# Patient Record
Sex: Male | Born: 1975 | Race: Black or African American | Hispanic: No | Marital: Single | Smoking: Current every day smoker
Health system: Southern US, Community
[De-identification: ages and names within clinical notes are randomized; demographics above are authoritative.]

## PROBLEM LIST (undated history)

## (undated) DIAGNOSIS — S329XXA Fracture of unspecified parts of lumbosacral spine and pelvis, initial encounter for closed fracture: Secondary | ICD-10-CM

## (undated) DIAGNOSIS — Z87442 Personal history of urinary calculi: Secondary | ICD-10-CM

## (undated) DIAGNOSIS — N200 Calculus of kidney: Secondary | ICD-10-CM

## (undated) DIAGNOSIS — G610 Guillain-Barre syndrome: Secondary | ICD-10-CM

## (undated) DIAGNOSIS — S2249XA Multiple fractures of ribs, unspecified side, initial encounter for closed fracture: Secondary | ICD-10-CM

## (undated) DIAGNOSIS — S2239XA Fracture of one rib, unspecified side, initial encounter for closed fracture: Secondary | ICD-10-CM

## (undated) DIAGNOSIS — S0990XA Unspecified injury of head, initial encounter: Secondary | ICD-10-CM

## (undated) DIAGNOSIS — N289 Disorder of kidney and ureter, unspecified: Secondary | ICD-10-CM

## (undated) DIAGNOSIS — Z87440 Personal history of urinary (tract) infections: Secondary | ICD-10-CM

## (undated) DIAGNOSIS — I10 Essential (primary) hypertension: Secondary | ICD-10-CM

## (undated) DIAGNOSIS — R51 Headache: Secondary | ICD-10-CM

## (undated) DIAGNOSIS — R519 Headache, unspecified: Secondary | ICD-10-CM

## (undated) HISTORY — PX: SPLENECTOMY: SUR1306

## (undated) HISTORY — PX: KNEE SURGERY: SHX244

---

## 2000-01-03 ENCOUNTER — Encounter: Payer: Self-pay | Admitting: Emergency Medicine

## 2000-01-04 ENCOUNTER — Inpatient Hospital Stay (HOSPITAL_COMMUNITY): Admission: EM | Admit: 2000-01-04 | Discharge: 2000-01-13 | Payer: Self-pay

## 2000-01-04 ENCOUNTER — Encounter: Payer: Self-pay | Admitting: Surgery

## 2000-01-04 ENCOUNTER — Encounter: Payer: Self-pay | Admitting: Emergency Medicine

## 2000-01-04 ENCOUNTER — Encounter: Payer: Self-pay | Admitting: Otolaryngology

## 2000-01-05 ENCOUNTER — Encounter: Payer: Self-pay | Admitting: Surgery

## 2000-01-06 ENCOUNTER — Encounter: Payer: Self-pay | Admitting: General Surgery

## 2000-01-08 ENCOUNTER — Encounter: Payer: Self-pay | Admitting: General Surgery

## 2000-01-09 ENCOUNTER — Encounter: Payer: Self-pay | Admitting: General Surgery

## 2000-01-10 ENCOUNTER — Encounter: Payer: Self-pay | Admitting: General Surgery

## 2000-01-11 ENCOUNTER — Encounter: Payer: Self-pay | Admitting: General Surgery

## 2000-01-13 ENCOUNTER — Encounter: Payer: Self-pay | Admitting: Surgery

## 2000-01-21 ENCOUNTER — Ambulatory Visit (HOSPITAL_COMMUNITY): Admission: RE | Admit: 2000-01-21 | Discharge: 2000-01-21 | Payer: Self-pay

## 2000-06-21 ENCOUNTER — Emergency Department (HOSPITAL_COMMUNITY): Admission: EM | Admit: 2000-06-21 | Discharge: 2000-06-21 | Payer: Self-pay | Admitting: *Deleted

## 2000-06-25 ENCOUNTER — Inpatient Hospital Stay (HOSPITAL_COMMUNITY): Admission: AD | Admit: 2000-06-25 | Discharge: 2000-09-21 | Payer: Self-pay | Admitting: Internal Medicine

## 2000-06-26 ENCOUNTER — Encounter: Payer: Self-pay | Admitting: Neurology

## 2000-06-26 ENCOUNTER — Encounter: Payer: Self-pay | Admitting: Pulmonary Disease

## 2000-06-27 ENCOUNTER — Encounter: Payer: Self-pay | Admitting: Pulmonary Disease

## 2000-06-28 ENCOUNTER — Encounter: Payer: Self-pay | Admitting: Neurology

## 2000-06-30 ENCOUNTER — Encounter: Payer: Self-pay | Admitting: Critical Care Medicine

## 2000-07-01 ENCOUNTER — Encounter: Payer: Self-pay | Admitting: Critical Care Medicine

## 2000-07-13 ENCOUNTER — Encounter: Payer: Self-pay | Admitting: Neurology

## 2000-07-17 ENCOUNTER — Encounter: Payer: Self-pay | Admitting: Neurology

## 2000-07-26 ENCOUNTER — Encounter: Payer: Self-pay | Admitting: Pulmonary Disease

## 2000-07-26 ENCOUNTER — Encounter: Payer: Self-pay | Admitting: Neurology

## 2000-07-27 ENCOUNTER — Encounter: Payer: Self-pay | Admitting: Neurology

## 2000-08-06 ENCOUNTER — Encounter: Payer: Self-pay | Admitting: Pediatrics

## 2000-09-03 ENCOUNTER — Encounter: Payer: Self-pay | Admitting: Neurology

## 2000-09-21 ENCOUNTER — Inpatient Hospital Stay (HOSPITAL_COMMUNITY)
Admission: RE | Admit: 2000-09-21 | Discharge: 2000-09-28 | Payer: Self-pay | Admitting: Physical Medicine and Rehabilitation

## 2000-09-28 ENCOUNTER — Inpatient Hospital Stay (HOSPITAL_COMMUNITY): Admission: AD | Admit: 2000-09-28 | Discharge: 2000-10-27 | Payer: Self-pay | Admitting: Neurology

## 2000-10-10 ENCOUNTER — Encounter: Payer: Self-pay | Admitting: Neurology

## 2000-10-14 ENCOUNTER — Encounter: Payer: Self-pay | Admitting: Neurology

## 2000-10-27 ENCOUNTER — Encounter: Payer: Self-pay | Admitting: Physical Medicine and Rehabilitation

## 2000-10-27 ENCOUNTER — Inpatient Hospital Stay (HOSPITAL_COMMUNITY)
Admission: RE | Admit: 2000-10-27 | Discharge: 2000-11-20 | Payer: Self-pay | Admitting: Physical Medicine and Rehabilitation

## 2000-11-04 ENCOUNTER — Encounter: Payer: Self-pay | Admitting: Physical Medicine and Rehabilitation

## 2000-11-25 ENCOUNTER — Encounter (HOSPITAL_COMMUNITY): Admission: RE | Admit: 2000-11-25 | Discharge: 2001-02-23 | Payer: Self-pay | Admitting: *Deleted

## 2000-11-25 ENCOUNTER — Encounter
Admission: RE | Admit: 2000-11-25 | Discharge: 2001-01-28 | Payer: Self-pay | Admitting: Physical Medicine and Rehabilitation

## 2000-11-30 ENCOUNTER — Emergency Department (HOSPITAL_COMMUNITY): Admission: EM | Admit: 2000-11-30 | Discharge: 2000-11-30 | Payer: Self-pay | Admitting: Emergency Medicine

## 2001-02-24 ENCOUNTER — Encounter (HOSPITAL_COMMUNITY): Admission: RE | Admit: 2001-02-24 | Discharge: 2001-05-25 | Payer: Self-pay | Admitting: *Deleted

## 2001-05-18 ENCOUNTER — Ambulatory Visit (HOSPITAL_COMMUNITY): Admission: RE | Admit: 2001-05-18 | Discharge: 2001-05-18 | Payer: Self-pay | Admitting: Neurology

## 2001-06-15 ENCOUNTER — Encounter (HOSPITAL_COMMUNITY): Admission: RE | Admit: 2001-06-15 | Discharge: 2001-09-13 | Payer: Self-pay | Admitting: Neurology

## 2001-08-19 ENCOUNTER — Ambulatory Visit (HOSPITAL_COMMUNITY): Admission: RE | Admit: 2001-08-19 | Discharge: 2001-08-19 | Payer: Self-pay | Admitting: Vascular Surgery

## 2004-05-17 ENCOUNTER — Emergency Department (HOSPITAL_COMMUNITY): Admission: EM | Admit: 2004-05-17 | Discharge: 2004-05-17 | Payer: Self-pay | Admitting: Emergency Medicine

## 2005-11-23 IMAGING — CR DG ABDOMEN ACUTE W/ 1V CHEST
3 series · 3 of 3 positions shown · non-contrast
Comparison: none

CLINICAL DATA: Lower abdominal pain.
 ACUTE ABDOMEN SERIES
 No prior studies. 
 There is indistinct opacity peripherally at the left lung base.  This was described on a prior chest radiograph from [HOSPITAL] dated 10/27/00 Although this could represent early pneumonia it more likely represents scarring given that it was described previously.  
 Bowel gas pattern appears unremarkable.  No significant abnormal calcific densities are identified.  No obvious organomegaly.  
 IMPRESSION
 Left basilar opacity likely represents scarring.  Otherwise negative examination.

[view not recorded (1 of 3)]
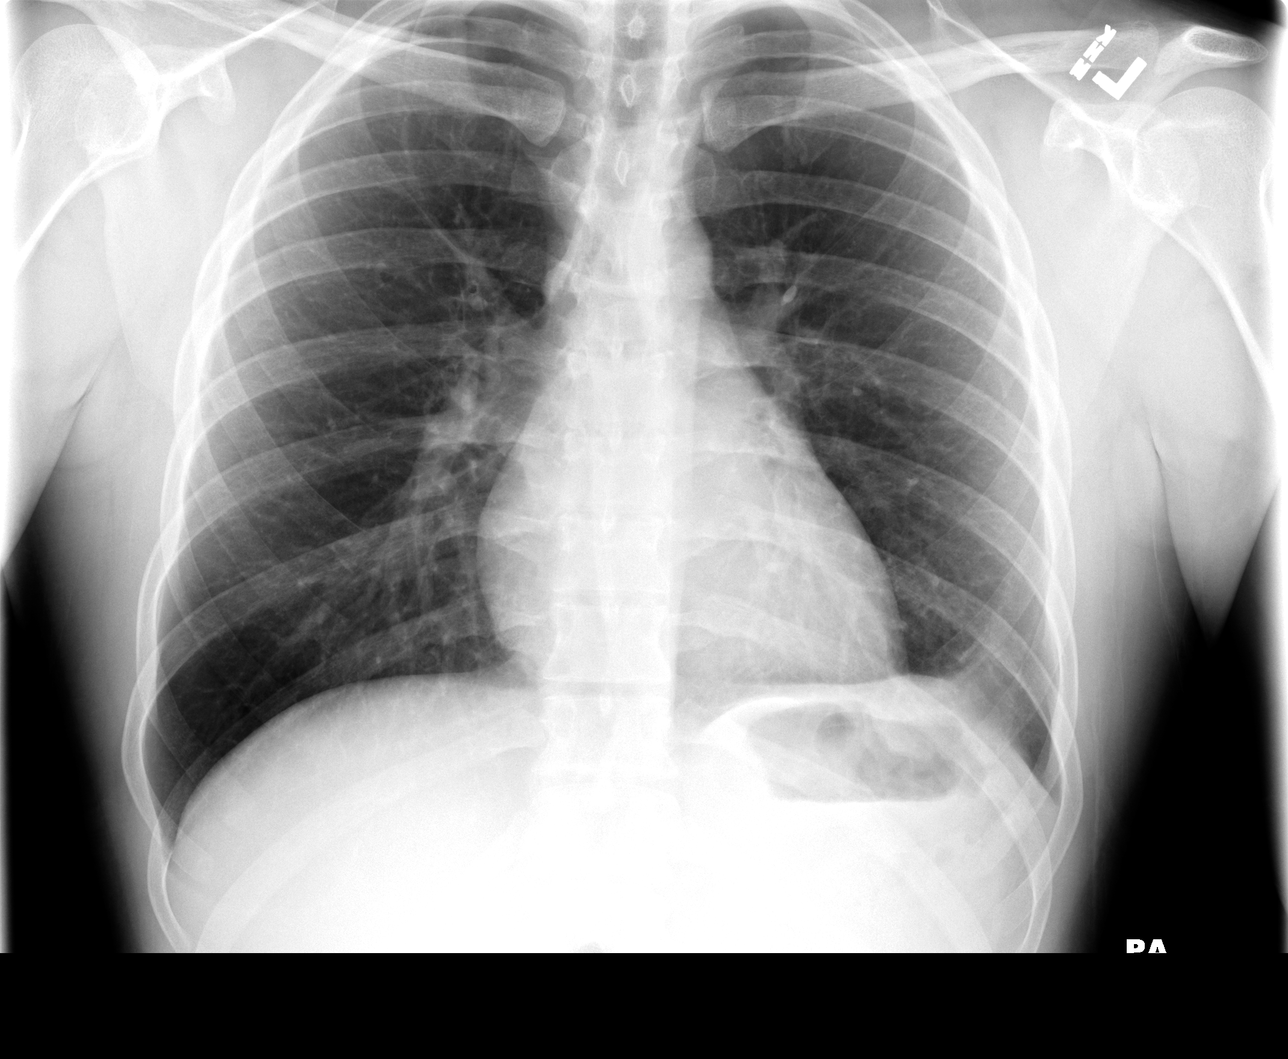

[view not recorded (2 of 3)]
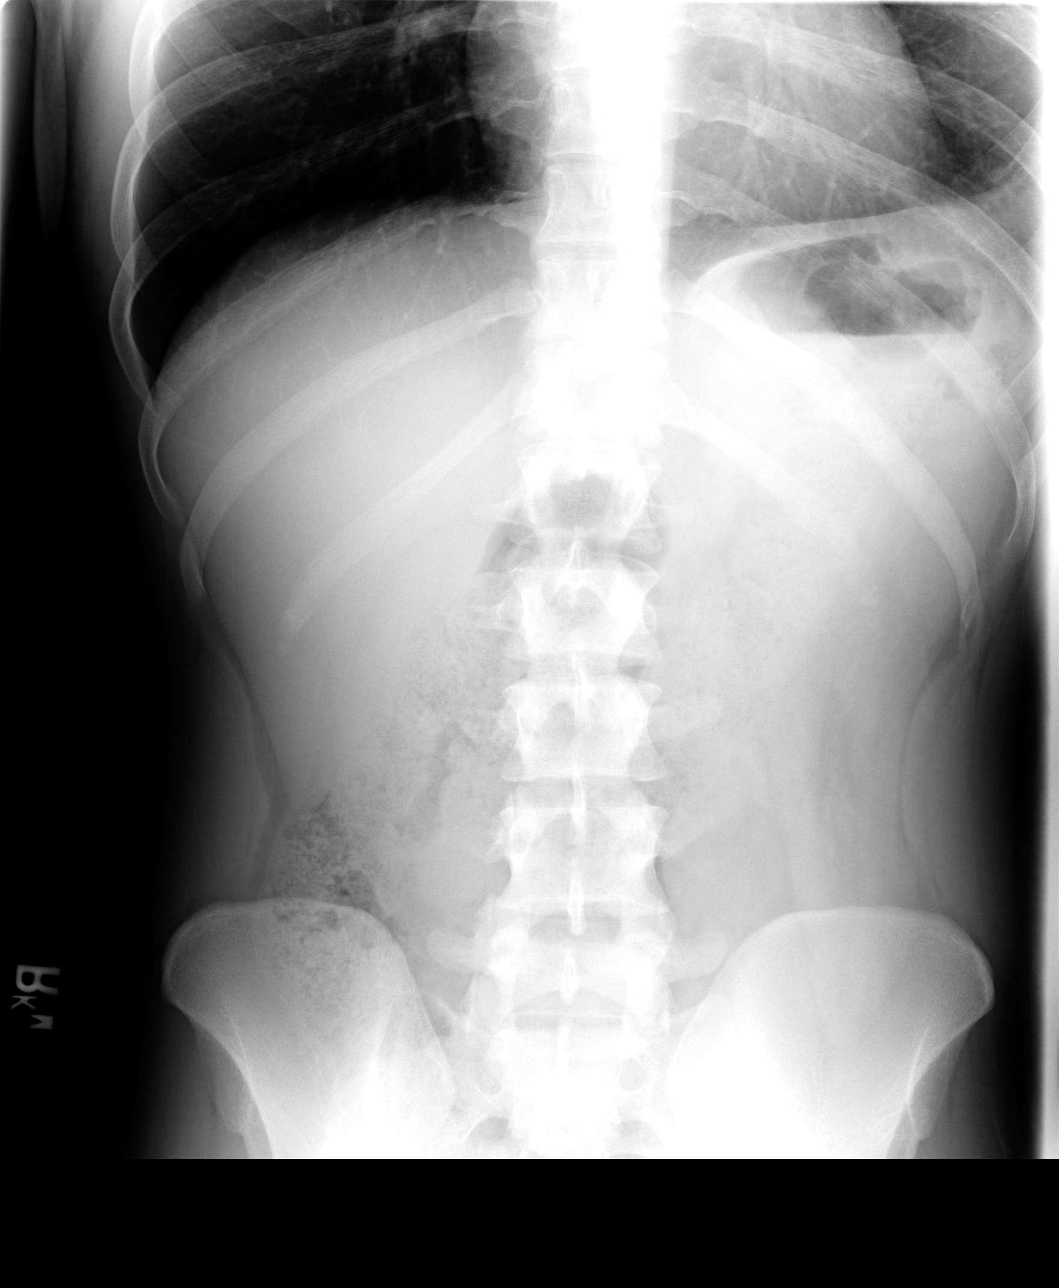

[view not recorded (3 of 3)]
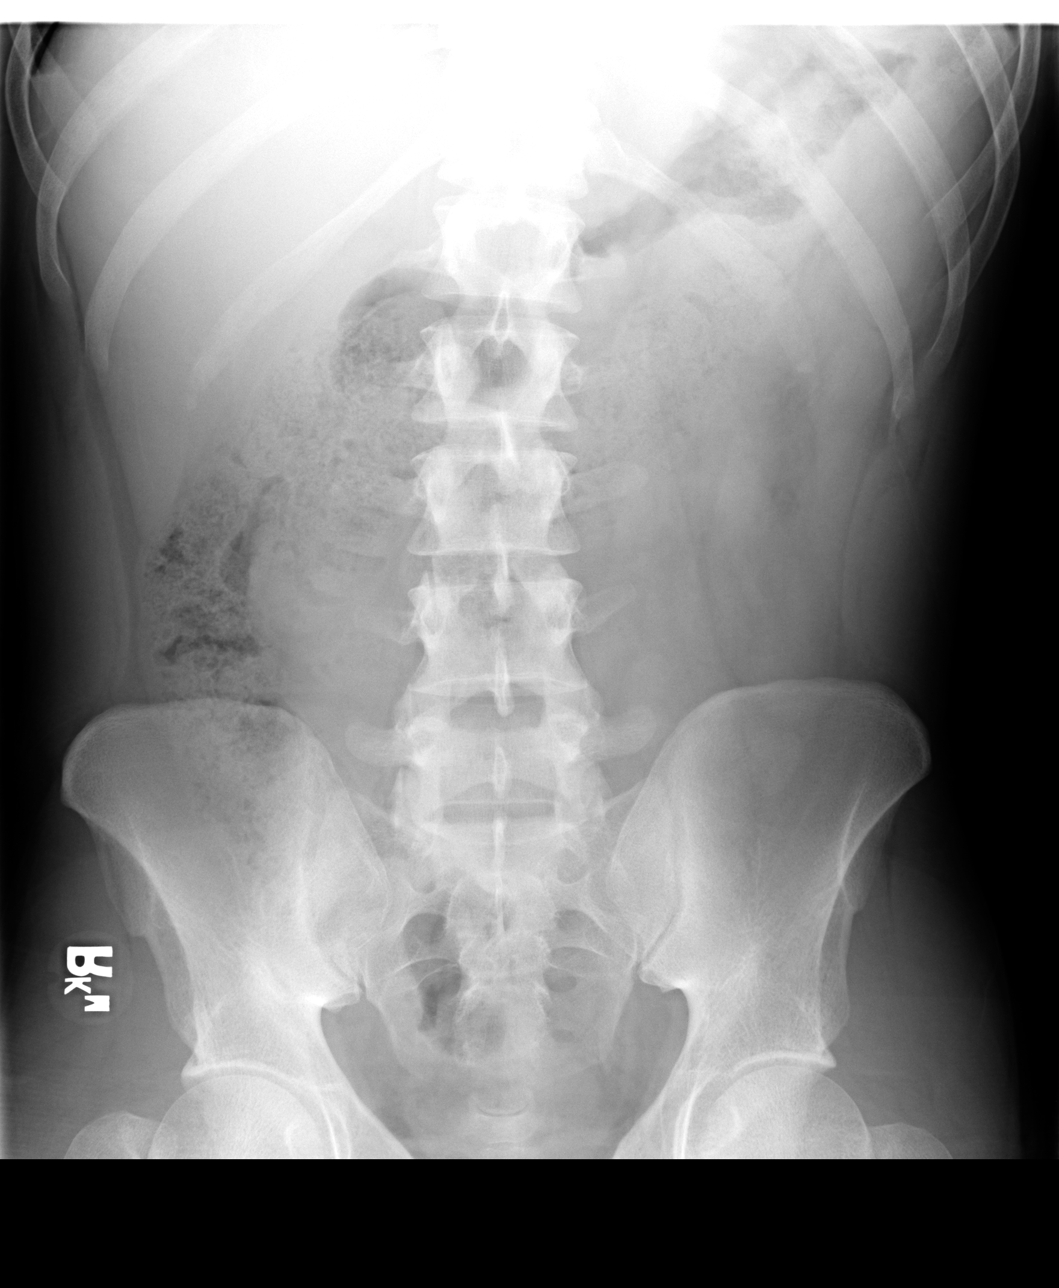

[3 of 3 positions shown; findings below may reference images not displayed]

## 2016-04-08 ENCOUNTER — Emergency Department (HOSPITAL_COMMUNITY)
Admission: EM | Admit: 2016-04-08 | Discharge: 2016-04-08 | Disposition: A | Discharge status: Home / Self Care | Payer: Self-pay | Attending: Emergency Medicine | Admitting: Emergency Medicine

## 2016-04-08 ENCOUNTER — Encounter (HOSPITAL_COMMUNITY): Payer: Self-pay | Admitting: Emergency Medicine

## 2016-04-08 DIAGNOSIS — F17210 Nicotine dependence, cigarettes, uncomplicated: Secondary | ICD-10-CM | POA: Insufficient documentation

## 2016-04-08 DIAGNOSIS — Z79899 Other long term (current) drug therapy: Secondary | ICD-10-CM | POA: Insufficient documentation

## 2016-04-08 DIAGNOSIS — R55 Syncope and collapse: Secondary | ICD-10-CM | POA: Insufficient documentation

## 2016-04-08 DIAGNOSIS — I10 Essential (primary) hypertension: Secondary | ICD-10-CM | POA: Insufficient documentation

## 2016-04-08 HISTORY — DX: Essential (primary) hypertension: I10

## 2016-04-08 LAB — CBC WITH DIFFERENTIAL/PLATELET
Basophils Absolute: 0 10*3/uL (ref 0.0–0.1)
Basophils Relative: 0 %
Eosinophils Absolute: 0 10*3/uL (ref 0.0–0.7)
Eosinophils Relative: 0 %
HCT: 41.7 % (ref 39.0–52.0)
Hemoglobin: 14.8 g/dL (ref 13.0–17.0)
Lymphocytes Relative: 9 %
Lymphs Abs: 1.6 10*3/uL (ref 0.7–4.0)
MCH: 32 pg (ref 26.0–34.0)
MCHC: 35.5 g/dL (ref 30.0–36.0)
MCV: 90.1 fL (ref 78.0–100.0)
Monocytes Absolute: 1 10*3/uL (ref 0.1–1.0)
Monocytes Relative: 6 %
Neutro Abs: 13.9 10*3/uL — ABNORMAL HIGH (ref 1.7–7.7)
Neutrophils Relative %: 85 %
Platelets: 290 10*3/uL (ref 150–400)
RBC: 4.63 MIL/uL (ref 4.22–5.81)
RDW: 13.3 % (ref 11.5–15.5)
WBC: 16.6 10*3/uL — ABNORMAL HIGH (ref 4.0–10.5)

## 2016-04-08 LAB — BASIC METABOLIC PANEL
Anion gap: 6 (ref 5–15)
BUN: 14 mg/dL (ref 6–20)
CO2: 29 mmol/L (ref 22–32)
Calcium: 9.5 mg/dL (ref 8.9–10.3)
Chloride: 104 mmol/L (ref 101–111)
Creatinine, Ser: 1.03 mg/dL (ref 0.61–1.24)
GFR calc Af Amer: >60 mL/min (ref >60)
GFR calc non Af Amer: >60 mL/min (ref >60)
Glucose, Bld: 96 mg/dL (ref 65–99)
Potassium: 4.6 mmol/L (ref 3.5–5.1)
Sodium: 139 mmol/L (ref 135–145)

## 2016-04-08 MED ORDER — SODIUM CHLORIDE 0.9 % IV BOLUS (SEPSIS)
1000.0000 mL | Freq: Once | INTRAVENOUS | Status: AC
  Administered 2016-04-08: 1000 mL via INTRAVENOUS

## 2016-04-08 NOTE — ED Provider Notes (Signed)
[facility]-EMERGENCY DEPT Provider Note   CSN: [id number] Arrival date & time: 04/08/16  1353     History   Chief Complaint Chief Complaint  Patient presents with  . Loss of Consciousness    HPI Oretha Milchnthony Sieh is a 40 y.o. male.  HPI   40 year old male presents today after a near syncopal episode. Patient reports that he was at work today when he bent down to pick up a box and upon standing felt dizzy and lightheaded. He reports that he did not lose consciousness, was able to sit down. Wife notes that when she saw him he looked extremely pale. Patient denies any preceding chest pain or shortness of breath, nausea or vomiting, fever or chills. He denies any neurological deficits. At time of evaluation patient reports symptoms have dramatically improved, with no more dizziness or concerning signs or symptoms. Patient reports that he has not been drinking adequate fluids at home, and had very little to eat for breakfast. Patient reports a history hypertension, notes that he did not take his antihypertensive medication this morning as it sometimes lowers his blood pressure too much.  Patient also notes that during his lunch break he drank a caffeinated beverage that had caffeine with the addition of ginseng, followed by smoking cigarettes.  Past Medical History:  Diagnosis Date  . Hypertension     There are no active problems to display for this patient.   Past Surgical History:  Procedure Laterality Date  . KNEE SURGERY    . SPLENECTOMY         Home Medications    Prior to Admission medications   Medication Sig Start Date End Date Taking? Authorizing Provider  lisinopril-hydrochlorothiazide (PRINZIDE,ZESTORETIC) 20-25 MG tablet Take 0.5 tablets by mouth daily.   Yes Historical Provider, MD  Multiple Vitamins-Minerals (MULTIVITAMIN ADULT) TABS Take 1 tablet by mouth daily.   Yes Historical Provider, MD    Family History No family history on file.  Social History Social  History  Substance Use Topics  . Smoking status: Current Every Day Smoker    Types: Cigarettes  . Smokeless tobacco: Never Used  . Alcohol use No     Allergies   Review of patient's allergies indicates no known allergies.   Review of Systems Review of Systems  All other systems reviewed and are negative.   Physical Exam Updated Vital Signs BP 145/96 (BP Location: Right Arm)   Pulse 81   Temp 98.4 F (36.9 C) (Oral)   Resp 18   SpO2 98%   Physical Exam  Constitutional: He is oriented to person, place, and time. He appears well-developed and well-nourished.  HENT:  Head: Normocephalic and atraumatic.  Eyes: Conjunctivae are normal. Pupils are equal, round, and reactive to light. Right eye exhibits no discharge. Left eye exhibits no discharge. No scleral icterus.  Neck: Normal range of motion. No JVD present. No tracheal deviation present.  Cardiovascular: Normal rate, regular rhythm, normal heart sounds and intact distal pulses.  Exam reveals no friction rub.   No murmur heard. Pulmonary/Chest: Effort normal. No stridor. No respiratory distress. He has no wheezes. He has no rales. He exhibits no tenderness.  Musculoskeletal: Normal range of motion. He exhibits no edema.  Neurological: He is alert and oriented to person, place, and time. Coordination normal.  Psychiatric: He has a normal mood and affect. His behavior is normal. Judgment and thought content normal.  Nursing note and vitals reviewed.    ED Treatments / Results  Labs (all labs  ordered are listed, but only abnormal results are displayed) Labs Reviewed  CBC WITH DIFFERENTIAL/PLATELET - Abnormal; Notable for the following:       Result Value   WBC 16.6 (*)    Neutro Abs 13.9 (*)    All other components within normal limits  BASIC METABOLIC PANEL    EKG  EKG Interpretation None       Radiology No results found.  Procedures Procedures (including critical care time)  Medications Ordered in  ED Medications  sodium chloride 0.9 % bolus 1,000 mL (0 mLs Intravenous Stopped 04/08/16 1715)     Initial Impression / Assessment and Plan / ED Course  I have reviewed the triage vital signs and the nursing notes.  Pertinent labs & imaging results that were available during my care of the patient were reviewed by me and considered in my medical decision making (see chart for details).  Clinical Course    Final Clinical Impressions(s) / ED Diagnoses   Final diagnoses:  Near syncope   Labs:  Imaging:  Consults:  Therapeutics:  Discharge Meds:   Assessment/Plan:  40 year old male presents after a syncopal episode. Likely vasovagal, symptoms improved here in the ED. He will be given normal saline, orthostatic vital signs, reassessed with likely disposition home.  Patient had concerning signs or symptoms on exam today that would necessitate further evaluation management here in the ED. Patient is discharged home after ambulation here in the ED, strict return precautions given. Patient verbalized understanding and agreement to today's plan had no further questions or concerns. He is instructed to follow-up with his primary care provider in 2-3 days for reevaluation.     New Prescriptions New Prescriptions   No medications on file     Eyvonne Mechanic, PA-C 04/08/16 1748    Vanetta Mulders, MD 04/10/16 [phone]

## 2016-04-08 NOTE — Progress Notes (Signed)
Patient listed as having no insurance and no pcp.  EDCM went to speak to patient at bedside.  Patient is unsure if he has Medicare or Medicaid insurance.  EDCM will speak to registration.  Patient reports [facility] [place].  System updated.

## 2016-04-08 NOTE — ED Triage Notes (Signed)
Per EMS patient comes from work for syncope.  Patient got sweaty, hot, and went to break room where co-worker states patient had LOC. Patient c/o general weakness, fatigue, and dizziness to EMS.  patient works in [employer] but hasn't had lot of PO fluids today. CBG 76. Pt has PMH HTN. Patient has 20g in left AC.

## 2016-04-08 NOTE — ED Notes (Signed)
PA at bedside.

## 2016-04-08 NOTE — Discharge Instructions (Signed)
Please drink plenty fluids, rest, avoid caffeinated beverages. Please follow-up with primary care provider in 2-3 days for reevaluation. Return to emergency room immediately expands any worsening signs or symptoms

## 2016-04-11 ENCOUNTER — Emergency Department (HOSPITAL_BASED_OUTPATIENT_CLINIC_OR_DEPARTMENT_OTHER): Payer: Medicare Other

## 2016-04-11 ENCOUNTER — Emergency Department (HOSPITAL_BASED_OUTPATIENT_CLINIC_OR_DEPARTMENT_OTHER)
Admission: EM | Admit: 2016-04-11 | Discharge: 2016-04-11 | Disposition: A | Discharge status: Home / Self Care | Payer: Medicare Other | Attending: Emergency Medicine | Admitting: Emergency Medicine

## 2016-04-11 ENCOUNTER — Encounter (HOSPITAL_BASED_OUTPATIENT_CLINIC_OR_DEPARTMENT_OTHER): Payer: Self-pay | Admitting: *Deleted

## 2016-04-11 DIAGNOSIS — F17210 Nicotine dependence, cigarettes, uncomplicated: Secondary | ICD-10-CM | POA: Insufficient documentation

## 2016-04-11 DIAGNOSIS — I10 Essential (primary) hypertension: Secondary | ICD-10-CM | POA: Insufficient documentation

## 2016-04-11 DIAGNOSIS — N132 Hydronephrosis with renal and ureteral calculous obstruction: Secondary | ICD-10-CM | POA: Insufficient documentation

## 2016-04-11 DIAGNOSIS — Z79899 Other long term (current) drug therapy: Secondary | ICD-10-CM | POA: Insufficient documentation

## 2016-04-11 HISTORY — DX: Calculus of kidney: N20.0

## 2016-04-11 HISTORY — DX: Guillain-Barre syndrome: G61.0

## 2016-04-11 HISTORY — DX: Disorder of kidney and ureter, unspecified: N28.9

## 2016-04-11 LAB — URINE MICROSCOPIC-ADD ON: Bacteria, UA: NONE SEEN

## 2016-04-11 LAB — URINALYSIS, ROUTINE W REFLEX MICROSCOPIC
Bilirubin Urine: NEGATIVE
Glucose, UA: NEGATIVE mg/dL
Ketones, ur: NEGATIVE mg/dL
Nitrite: NEGATIVE
Protein, ur: NEGATIVE mg/dL
Specific Gravity, Urine: 1.014 (ref 1.005–1.030)
pH: 6.5 (ref 5.0–8.0)

## 2016-04-11 MED ORDER — KETOROLAC TROMETHAMINE 30 MG/ML IJ SOLN
30.0000 mg | Freq: Once | INTRAMUSCULAR | Status: AC
  Administered 2016-04-11: 30 mg via INTRAVENOUS
  Filled 2016-04-11: qty 1

## 2016-04-11 MED ORDER — OXYCODONE-ACETAMINOPHEN 5-325 MG PO TABS: 1.0000 | ORAL_TABLET | Freq: Four times a day (QID) | ORAL | 0 refills | Status: AC | PRN

## 2016-04-11 NOTE — ED Notes (Signed)
Patient transported to CT 

## 2016-04-11 NOTE — ED Triage Notes (Signed)
C/o right abd pain. No n/v/d. No problems urinating. No injury. Pain started this am.

## 2016-04-11 NOTE — ED Provider Notes (Signed)
[facility]   CSN: [id number] Arrival date & time: 04/11/16  1745  By signing my name below, I, Phillis Haggis, attest that this documentation has been prepared under the direction and in the presence of Felicie Morn, NP-C. Electronically Signed: Phillis Haggis, ED Scribe. 04/11/16. 8:18 PM.  History   Chief Complaint Chief Complaint  Patient presents with  . Abdominal Pain    The history is provided by the patient. No language interpreter was used.  Abdominal Pain   This is a new problem. The current episode started 6 to 12 hours ago. The problem occurs constantly. The problem has been gradually worsening. The pain is located in the RUQ. The pain is moderate. Pertinent negatives include diarrhea, nausea, vomiting and dysuria.  HPI Comments: Kevin Pratt is a 40 y.o. Male with a hx of HTN, renal stones, renal disorder and Guillain Barre syndrome who presents to the Emergency Department complaining of sudden onset, gradually worsening RUQ abdominal pain that radiates to the right flank onset earlier today. Pt reports hx of kidney stones many years ago and states that his current pain feels similar. He has not taken anything for his symptoms. He takes Lisinopril 30 mg daily for HTN. He denies nausea, vomiting, diarrhea, or dysuria. He denies allergies to medications.   Past Medical History:  Diagnosis Date  . Guillain Barr syndrome (HCC)   . Hypertension   . Renal disorder   . Renal stones     There are no active problems to display for this patient.   Past Surgical History:  Procedure Laterality Date  . KNEE SURGERY    . SPLENECTOMY        Home Medications    Prior to Admission medications   Medication Sig Start Date End Date Taking? Authorizing Provider  lisinopril-hydrochlorothiazide (PRINZIDE,ZESTORETIC) 20-25 MG tablet Take 30 tablets by mouth daily.    Yes Historical Provider, MD  Multiple Vitamins-Minerals (MULTIVITAMIN ADULT) TABS Take 1  tablet by mouth daily.   Yes Historical Provider, MD    Family History No family history on file.  Social History Social History  Substance Use Topics  . Smoking status: Current Every Day Smoker    Packs/day: 0.50    Types: Cigarettes  . Smokeless tobacco: Never Used  . Alcohol use No     Allergies   Review of patient's allergies indicates no known allergies.   Review of Systems Review of Systems  Gastrointestinal: Positive for abdominal pain. Negative for diarrhea, nausea and vomiting.  Genitourinary: Negative for dysuria.  All other systems reviewed and are negative.    Physical Exam Updated Vital Signs BP 157/100 (BP Location: Left Arm)   Pulse 77   Temp 98.2 F (36.8 C) (Oral)   Resp 18   Ht 5\' 7"  (1.702 m)   Wt 167 lb (75.8 kg)   SpO2 100%   BMI 26.16 kg/m   Physical Exam  Constitutional: He is oriented to person, place, and time. He appears well-developed and well-nourished.  HENT:  Head: Normocephalic and atraumatic.  Eyes: EOM are normal. Pupils are equal, round, and reactive to light.  Neck: Normal range of motion. Neck supple.  Cardiovascular: Normal rate, regular rhythm and normal heart sounds.   Pulmonary/Chest: Effort normal and breath sounds normal.  Abdominal: Soft. There is tenderness in the right upper quadrant. There is CVA tenderness.  Mild right CVA tenderness  Musculoskeletal: Normal range of motion.  Neurological: He is alert and oriented to person, place, and  time.  Skin: Skin is warm and dry.  Psychiatric: He has a normal mood and affect. His behavior is normal.  Nursing note and vitals reviewed.    ED Treatments / Results  DIAGNOSTIC STUDIES: Oxygen Saturation is 100% on RA, normal by my interpretation.    COORDINATION OF CARE: 8:15 PM-Discussed treatment plan which includes labs and CT scan with pt at bedside and pt agreed to plan.    Labs (all labs ordered are listed, but only abnormal results are displayed) Labs  Reviewed  URINALYSIS, ROUTINE W REFLEX MICROSCOPIC (NOT AT [facility]) - Abnormal; Notable for the following:       Result Value   Hgb urine dipstick SMALL (*)    Leukocytes, UA TRACE (*)    All other components within normal limits  URINE MICROSCOPIC-ADD ON - Abnormal; Notable for the following:    Squamous Epithelial / LPF 0-5 (*)    All other components within normal limits    EKG  EKG Interpretation None       Radiology Ct Renal Stone Study  Result Date: 04/11/2016 CLINICAL DATA:  RIGHT flank pain for a few weeks, hematuria. History of hypertension, splenectomy and renal stones. EXAM: CT ABDOMEN AND PELVIS WITHOUT CONTRAST TECHNIQUE: Multidetector CT imaging of the abdomen and pelvis was performed following the standard protocol without IV contrast. COMPARISON:  CT abdomen and pelvis May 17, 2004 FINDINGS: LOWER CHEST: Lung bases are clear. LEFT lung base scarring with mildly elevated LEFT hemidiaphragm, unchanged. Coarse LEFT diaphragmatic calcifications. The visualized heart size is normal. No pericardial effusion. HEPATOBILIARY: Normal. PANCREAS: Normal. SPLEEN: Surgically absent. ADRENALS/URINARY TRACT: Kidneys are orthotopic, demonstrating normal size and morphology. 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 Punctate RIGHT upper pole nephrolithiasis. 2 mm LEFT upper pole nephrolithiasis without LEFT obstructive uropathy. Limited assessment for renal masses on this nonenhanced examination. Urinary bladder is partially distended and unremarkable. 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 STOMACH/BOWEL: The stomach, small and large bowel are normal in course and caliber without inflammatory changes, the sensitivity may be decreased by lack of enteric contrast. Normal appendix. VASCULAR/LYMPHATIC: Aortoiliac vessels are normal in course and caliber. No lymphadenopathy by CT size criteria.  REPRODUCTIVE: Normal. OTHER: No intraperitoneal free fluid or free air. MUSCULOSKELETAL: Non-acute.  Anterior abdominal wall scarring. IMPRESSION: 6 mm RIGHT mid ureteral calculus results in severe obstructive uropathy. Small nonobstructing LEFT nephrolithiasis. 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 Electronically Signed   By: Awilda Metroourtnay  Bloomer M.D.   On: 04/11/2016 21:20    Procedures Procedures (including critical care time)  Medications Ordered in ED Medications - No data to display   Initial Impression / Assessment and Plan / ED Course  I have reviewed the triage vital signs and the nursing notes.  Pertinent labs & imaging results that were available during my care of the patient were reviewed by me and considered in my medical decision making (see chart for details).  Clinical Course  No fever. No indication of infection. Patient feels better after toradol. Spoke with urology (Dahlstedt). Will follow-up with the patient in the office on Monday. Care instructions provided. Return precautions discussed.    Final Clinical Impressions(s) / ED Diagnoses  Ureteral stone. Final diagnoses:  None  I personally performed the services described in this documentation, which was scribed in my presence. The recorded information has been reviewed and  is accurate.    New Prescriptions New Prescriptions   No medications on file     Felicie Morn, NP 04/12/16 1610    Rolan Bucco, MD 04/13/16 [phone]

## 2016-06-05 ENCOUNTER — Other Ambulatory Visit: Payer: Self-pay | Admitting: Urology

## 2016-06-05 NOTE — Progress Notes (Signed)
Please place orders in epic for 11-17 surgery pre op is 11-15

## 2016-06-18 ENCOUNTER — Other Ambulatory Visit (HOSPITAL_COMMUNITY): Payer: Medicare Other

## 2016-06-18 NOTE — H&P (Signed)
HPI: Kevin Pratt is a 40 year-old male with a ureteral calculus.  The the kidney stones were on the right side. He first noticed the symptoms 04/11/2016. This is not his first kidney stone. There is not a history of calculus disease in the family. He is not currently having flank pain, back pain, groin pain, nausea, vomiting, fever or chills. He does not have a burning sensation when he urinates. He has not caught a stone in his urine strainer since his symptoms began.   A CT scan 04/11/16 revealed a 6 mm stone in the proximal right ureter. It had Hounsfield units of 1200. There were punctate calcifications associated with the left kidney that may or may not have been actual stones. In addition his left kidney is displaced cephalad due to his previous splenectomy.   Interval history: We have discussed the options for treatment and he elected to proceed with medical expulsive therapy and therefore was placed on Rapaflo.  He reports he passed a stone spontaneously in 1998.   Interval history 05/06/16: He had not had any further pain. He did have some slight feeling of urgency but was unable to void a significant amount about a week ago but that has now resolved. He has been straining his urine but had not seen the stone pass.   Interval history 06/05/16: He remains asymptomatic. He has not had any hematuria. He has not seen any stone pass.     ALLERGIES: None   MEDICATIONS: Lisinopril 30 mg tablet 1 tablet PO Daily  Oxycodone Hcl 10 mg tablet 1-2 tablet PO Q 4 H  Multivitamin     GU PSH: None   NON-GU PSH: Knee Arthroscopy/surgery Remove Spleen; Total    GU PMH: Calculus Ureter (Stable), We discussed the fact that his stone was fairly dense but it was located over the sacrum which makes it difficult to definitely rule out the presence of the stone however his urine again is noted to be clear. I think he has probably passed a stone but in order to be sure I'm going to have him return in 1 month  for a KUB and right renal ultrasound. - 05/06/2016, (Acute), Right, We discussed the management of urinary stones. These options include observation, ureteroscopy, shockwave lithotripsy, and PCNL. We discussed which options are relevant to these particular stones. We discussed the natural history of stones as well as the complications of untreated stones and the impact on quality of life without treatment as well as with each of the above listed treatments. We also discussed the efficacy of each treatment in its ability to clear the stone burden. With any of these management options I discussed the signs and symptoms of infection and the need for emergent treatment should these be experienced. For each option we discussed the ability of each procedure to clear the patient of their stone burden. For observation I described the risks which include but are not limited to silent renal damage, life-threatening infection, need for emergent surgery, failure to pass stone, and pain. For ureteroscopy I described the risks which include heart attack, stroke, pulmonary embolus, death, bleeding, infection, damage to contiguous structures, positioning injury, ureteral stricture, ureteral avulsion, ureteral injury, need for ureteral stent, inability to perform ureteroscopy, need for an interval procedure, inability to clear stone burden, stent discomfort and pain. For shockwave lithotripsy I described the risks which include arrhythmia, kidney contusion, kidney hemorrhage, need for transfusion, long-term risk of diabetes or hypertension, back discomfort, flank ecchymosis, flank abrasion,  inability to break up stone, inability to pass stone fragments, Steinstrasse, infection associated with obstructing stones, need for different surgical procedure and possible need for repeat shockwave lithotripsy. 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 Either way they are so small layer of clinical significance. - 04/21/2016    NON-GU PMH: Hypertension    FAMILY HISTORY: Cardiac Arrest - Father Death of family member - Father   SOCIAL HISTORY: Marital Status: Single Current Smoking Status: Patient does not smoke anymore.  Has never drank.  Drinks 1 caffeinated drink per day. Has had a blood transfusion.    REVIEW OF SYSTEMS:    GU Review Male:   Patient denies frequent urination, hard to postpone urination, burning/ pain with urination, get up at night to urinate, leakage of urine, stream starts and stops, trouble starting your stream, have to strain to urinate , erection problems, and penile pain.  Gastrointestinal (Upper):   Patient denies nausea, vomiting, and indigestion/ heartburn.  Gastrointestinal (Lower):   Patient denies diarrhea and constipation.  Constitutional:   Patient denies fever, night sweats, weight loss, and fatigue.  Skin:   Patient denies skin rash/ lesion and itching.  Eyes:   Patient denies blurred vision and double vision.  Ears/ Nose/ Throat:   Patient denies sore throat and sinus problems.  Hematologic/Lymphatic:   Patient denies swollen glands and easy bruising.  Cardiovascular:   Patient denies leg swelling and chest pains.  Respiratory:   Patient denies cough and shortness of breath.  Endocrine:   Patient denies excessive thirst.  Musculoskeletal:   Patient denies back pain and joint pain.  Neurological:   Patient denies headaches and dizziness.  Psychologic:   Patient denies depression and anxiety.   VITAL SIGNS:    BP 140/86 mmHg  Pulse 81 /min   PHYSICAL EXAMINATION:    Constitutional: Well-nourished. No physical deformities. Normally developed. Good grooming.  Neck: Neck symmetrical, not swollen. Normal tracheal position.  Respiratory: No labored breathing, no use of accessory muscles.   Cardiovascular: Normal temperature, normal extremity pulses, no  swelling, no varicosities.  Lymphatic: No enlargement of neck, axillae, groin.  Skin: No paleness, no jaundice, no cyanosis. No lesion, no ulcer, no rash.  Neurologic / Psychiatric: Oriented to time, oriented to place, oriented to person. No depression, no anxiety, no agitation.  Gastrointestinal: No mass, no tenderness, no rigidity, non obese abdomen.  Eyes: Normal conjunctivae. Normal eyelids.  Ears, Nose, Mouth, and Throat: Left ear no scars, no lesions, no masses. Right ear no scars, no lesions, no masses. Nose no scars, no lesions, no masses. Normal hearing. Normal lips.  Musculoskeletal: Normal gait and station of head and neck.    PAST DATA REVIEWED:  Source Of History:  Patient  Records Review:   Previous Patient Records  X-Ray Review: [facility] Protocol: Reviewed Films. I compared his previous CT scan as well as KUB with his KUB and renal ultrasound today.    PROCEDURES:         Renal Ultrasound (Limited) - 16109  RT Kidney: Length: 12.3 cm Depth: 5.8 cm Cortical Width:1.2 cm Width: 5.0 cm    Right Kidney/Ureter:  Cystic area UP with calc= 1.5x1.8x1.6cm, hydro. noted, Dilated ureter= 1.8cm  Bladder:  Not Seen.          KUB - 74000  A single view of the abdomen is obtained.  I cannot  see a stone along the expected course of the ureter.         Urinalysis Dipstick Dipstick Cont'd  Color: Yellow Bilirubin: Neg  Appearance: Clear Ketones: Neg  Specific Gravity: 1.015 Blood: Neg  pH: 6.0 Protein: Neg  Glucose: Neg Urobilinogen: 0.2    Nitrites: Neg    Leukocyte Esterase: Neg    ASSESSMENT/PLAN:      ICD-10 Details  1 GU:   Calculus Ureter - N20.1 Stable - Although I cannot see his stone on his KUB he has hydronephrosis on his ultrasound. We discussed repeating a CT scan as one option but if a stone was present he would still require ureteroscopic management. I therefore have recommended proceeding with cystoscopy, right retrograde pyelogram and right ureteroscopy  with laser lithotripsy if his stone remains present. I went over the procedure when in detail including its risks and complications, the outpatient nature of the procedure. The probability of success and anticipated postoperative course. He understands and is elected to proceed.  2   Kidney Stone - N20.0 Bilateral, Stable - His renal calculi are stable.

## 2016-06-19 ENCOUNTER — Encounter (HOSPITAL_COMMUNITY)
Admission: RE | Admit: 2016-06-19 | Discharge: 2016-06-19 | Disposition: A | Discharge status: Home / Self Care | Payer: Self-pay | Source: Ambulatory Visit | Attending: Urology | Admitting: Urology

## 2016-06-19 ENCOUNTER — Encounter (HOSPITAL_COMMUNITY): Payer: Self-pay

## 2016-06-19 DIAGNOSIS — N132 Hydronephrosis with renal and ureteral calculous obstruction: Secondary | ICD-10-CM | POA: Insufficient documentation

## 2016-06-19 DIAGNOSIS — Z87442 Personal history of urinary calculi: Secondary | ICD-10-CM | POA: Insufficient documentation

## 2016-06-19 DIAGNOSIS — I10 Essential (primary) hypertension: Secondary | ICD-10-CM | POA: Insufficient documentation

## 2016-06-19 DIAGNOSIS — Z01810 Encounter for preprocedural cardiovascular examination: Secondary | ICD-10-CM | POA: Insufficient documentation

## 2016-06-19 DIAGNOSIS — Z01812 Encounter for preprocedural laboratory examination: Secondary | ICD-10-CM | POA: Insufficient documentation

## 2016-06-19 HISTORY — DX: Guillain-Barre syndrome: G61.0

## 2016-06-19 HISTORY — DX: Personal history of urinary (tract) infections: Z87.440

## 2016-06-19 HISTORY — DX: Fracture of one rib, unspecified side, initial encounter for closed fracture: S22.39XA

## 2016-06-19 HISTORY — DX: Personal history of urinary calculi: Z87.442

## 2016-06-19 HISTORY — DX: Fracture of unspecified parts of lumbosacral spine and pelvis, initial encounter for closed fracture: S32.9XXA

## 2016-06-19 HISTORY — DX: Headache, unspecified: R51.9

## 2016-06-19 HISTORY — DX: Headache: R51

## 2016-06-19 HISTORY — DX: Unspecified injury of head, initial encounter: S09.90XA

## 2016-06-19 HISTORY — DX: Multiple fractures of ribs, unspecified side, initial encounter for closed fracture: S22.49XA

## 2016-06-19 LAB — BASIC METABOLIC PANEL
ANION GAP: 7 (ref 5–15)
BUN: 9 mg/dL (ref 6–20)
CALCIUM: 9.4 mg/dL (ref 8.9–10.3)
CO2: 28 mmol/L (ref 22–32)
Chloride: 106 mmol/L (ref 101–111)
Creatinine, Ser: 0.9 mg/dL (ref 0.61–1.24)
GFR calc Af Amer: >60 mL/min (ref >60)
GFR calc non Af Amer: >60 mL/min (ref >60)
Glucose, Bld: 86 mg/dL (ref 65–99)
Potassium: 4.1 mmol/L (ref 3.5–5.1)
Sodium: 141 mmol/L (ref 135–145)

## 2016-06-19 LAB — CBC
HCT: 43.1 % (ref 39.0–52.0)
HEMOGLOBIN: 14.8 g/dL (ref 13.0–17.0)
MCH: 31.9 pg (ref 26.0–34.0)
MCHC: 34.3 g/dL (ref 30.0–36.0)
MCV: 92.9 fL (ref 78.0–100.0)
Platelets: 280 10*3/uL (ref 150–400)
RBC: 4.64 MIL/uL (ref 4.22–5.81)
RDW: 13.6 % (ref 11.5–15.5)
WBC: 7.8 10*3/uL (ref 4.0–10.5)

## 2016-06-19 NOTE — Patient Instructions (Signed)
Kevin Pratt  06/19/2016   Your procedure is scheduled on: Friday June 20, 2016  [facility]  Entrance [place]  elevators to 3rd floor to  Short Stay Center at 6:45 AM.  Call this number if you have problems the morning of surgery [phone]   Remember: ONLY 1 PERSON MAY GO WITH YOU TO SHORT STAY TO GET  READY MORNING OF YOUR SURGERY.  Do not eat food or drink liquids :After Midnight.     Take these medicines the morning of surgery with A SIP OF WATER: NONE                                You may not have any metal on your body including hair pins and              piercings  Do not wear jewelry, lotions, powders or colognes, deodorant                         Men may shave face and neck.   Do not bring valuables to the hospital. [facility] IS NOT             RESPONSIBLE   FOR VALUABLES.  Contacts, dentures or bridgework may not be worn into surgery.      Patients discharged the day of surgery will not be allowed to drive home.  Name and phone number of your driver:Kevin Remus BlakeSearcy (girlfriend)  ___________             [facility] Before surgery, you can play an important role.  Because skin is not sterile, your skin needs to be as free of germs as possible.  You can reduce the number of germs on your skin by washing with CHG (chlorahexidine gluconate) soap before surgery.  CHG is an antiseptic cleaner which kills germs and bonds with the skin to continue killing germs even after washing. Please DO NOT use if you have an allergy to CHG or antibacterial soaps.  If your skin becomes reddened/irritated stop using the CHG and inform your nurse when you arrive at Short Stay. Do not shave (including legs and underarms) for at least 48 hours prior to the first CHG shower.  You may shave your face/neck. Please follow these instructions carefully:  1.  Shower with CHG Soap the night before surgery and the  morning of  Surgery.  2.  If you choose to wash your hair, wash your hair first as usual with your  normal  shampoo.  3.  After you shampoo, rinse your hair and body thoroughly to remove the  shampoo.                           4.  Use CHG as you would any other liquid soap.  You can apply chg directly  to the skin and wash                       Gently with a scrungie or clean washcloth.  5.  Apply the CHG Soap to your body ONLY FROM THE NECK DOWN.   Do not use on face/ open  Wound or open sores. Avoid contact with eyes, ears mouth and genitals (private parts).                       Wash face,  Genitals (private parts) with your normal soap.             6.  Wash thoroughly, paying special attention to the area where your surgery  will be performed.  7.  Thoroughly rinse your body with warm water from the neck down.  8.  DO NOT shower/wash with your normal soap after using and rinsing off  the CHG Soap.                9.  Pat yourself dry with a clean towel.            10.  Wear clean pajamas.            11.  Place clean sheets on your bed the night of your first shower and do not  sleep with pets. Day of Surgery : Do not apply any lotions/deodorants the morning of surgery.  Please wear clean clothes to the hospital/surgery center.  FAILURE TO FOLLOW THESE INSTRUCTIONS MAY RESULT IN THE CANCELLATION OF YOUR SURGERY PATIENT SIGNATURE_________________________________  NURSE SIGNATURE__________________________________  ________________________________________________________________________

## 2016-06-20 ENCOUNTER — Ambulatory Visit (HOSPITAL_COMMUNITY): Payer: Self-pay

## 2016-06-20 ENCOUNTER — Ambulatory Visit (HOSPITAL_COMMUNITY): Payer: Self-pay | Admitting: Anesthesiology

## 2016-06-20 ENCOUNTER — Encounter (HOSPITAL_COMMUNITY): Payer: Self-pay | Admitting: *Deleted

## 2016-06-20 ENCOUNTER — Encounter (HOSPITAL_COMMUNITY)
Admission: RE | Disposition: A | Discharge status: Home / Self Care | Payer: Self-pay | Source: Ambulatory Visit | Attending: Urology

## 2016-06-20 ENCOUNTER — Ambulatory Visit (HOSPITAL_COMMUNITY)
Admission: RE | Admit: 2016-06-20 | Discharge: 2016-06-20 | Disposition: A | Discharge status: Home / Self Care | Payer: Self-pay | Source: Ambulatory Visit | Attending: Urology | Admitting: Urology

## 2016-06-20 DIAGNOSIS — N200 Calculus of kidney: Secondary | ICD-10-CM

## 2016-06-20 DIAGNOSIS — I10 Essential (primary) hypertension: Secondary | ICD-10-CM | POA: Insufficient documentation

## 2016-06-20 DIAGNOSIS — Z87891 Personal history of nicotine dependence: Secondary | ICD-10-CM | POA: Insufficient documentation

## 2016-06-20 DIAGNOSIS — N202 Calculus of kidney with calculus of ureter: Secondary | ICD-10-CM | POA: Insufficient documentation

## 2016-06-20 HISTORY — PX: CYSTOSCOPY WITH RETROGRADE PYELOGRAM, URETEROSCOPY AND STENT PLACEMENT: SHX5789

## 2016-06-20 SURGERY — CYSTOURETEROSCOPY, WITH RETROGRADE PYELOGRAM AND STENT INSERTION
Anesthesia: General | Laterality: Right

## 2016-06-20 MED ORDER — HYDROMORPHONE HCL 1 MG/ML IJ SOLN
INTRAMUSCULAR | Status: AC
  Filled 2016-06-20: qty 1

## 2016-06-20 MED ORDER — PROMETHAZINE HCL 25 MG/ML IJ SOLN: 6.2500 mg | INTRAMUSCULAR | Status: DC | PRN

## 2016-06-20 MED ORDER — DEXAMETHASONE SODIUM PHOSPHATE 10 MG/ML IJ SOLN
INTRAMUSCULAR | Status: AC
  Filled 2016-06-20: qty 1

## 2016-06-20 MED ORDER — LIDOCAINE 2% (20 MG/ML) 5 ML SYRINGE
INTRAMUSCULAR | Status: DC | PRN
  Administered 2016-06-20: 50 mg via INTRAVENOUS

## 2016-06-20 MED ORDER — MIDAZOLAM HCL 5 MG/5ML IJ SOLN
INTRAMUSCULAR | Status: DC | PRN
  Administered 2016-06-20: 2 mg via INTRAVENOUS

## 2016-06-20 MED ORDER — LIDOCAINE HCL 2 % EX GEL
CUTANEOUS | Status: DC | PRN
  Administered 2016-06-20: 1 via URETHRAL

## 2016-06-20 MED ORDER — IOPAMIDOL (ISOVUE-300) INJECTION 61%
INTRAVENOUS | Status: DC | PRN
Start: 2016-06-20 — End: 2016-06-20
  Administered 2016-06-20: 50 mL

## 2016-06-20 MED ORDER — METOPROLOL TARTRATE 5 MG/5ML IV SOLN
INTRAVENOUS | Status: DC | PRN
  Administered 2016-06-20 (×2): 2.5 mg via INTRAVENOUS

## 2016-06-20 MED ORDER — DEXAMETHASONE SODIUM PHOSPHATE 10 MG/ML IJ SOLN
INTRAMUSCULAR | Status: DC | PRN
  Administered 2016-06-20: 10 mg via INTRAVENOUS

## 2016-06-20 MED ORDER — PROPOFOL 10 MG/ML IV BOLUS
INTRAVENOUS | Status: AC
  Filled 2016-06-20: qty 20

## 2016-06-20 MED ORDER — SODIUM CHLORIDE 0.9 % IR SOLN
Status: DC | PRN
  Administered 2016-06-20: 3000 mL

## 2016-06-20 MED ORDER — ONDANSETRON HCL 4 MG/2ML IJ SOLN
INTRAMUSCULAR | Status: DC | PRN
  Administered 2016-06-20: 4 mg via INTRAVENOUS

## 2016-06-20 MED ORDER — MIDAZOLAM HCL 2 MG/2ML IJ SOLN
INTRAMUSCULAR | Status: AC
  Filled 2016-06-20: qty 2

## 2016-06-20 MED ORDER — SODIUM CHLORIDE 0.9 % IR SOLN
Status: DC | PRN
  Administered 2016-06-20: 1000 mL

## 2016-06-20 MED ORDER — CIPROFLOXACIN IN D5W 200 MG/100ML IV SOLN
200.0000 mg | INTRAVENOUS | Status: AC
  Administered 2016-06-20: 200 mg via INTRAVENOUS
  Filled 2016-06-20: qty 100

## 2016-06-20 MED ORDER — OXYCODONE HCL 10 MG PO TABS: 10.0000 mg | ORAL_TABLET | ORAL | 0 refills | Status: AC | PRN

## 2016-06-20 MED ORDER — PHENAZOPYRIDINE HCL 200 MG PO TABS: 200.0000 mg | ORAL_TABLET | Freq: Three times a day (TID) | ORAL | 0 refills | Status: AC | PRN

## 2016-06-20 MED ORDER — HYDROMORPHONE HCL 1 MG/ML IJ SOLN
0.2500 mg | INTRAMUSCULAR | Status: DC | PRN
  Administered 2016-06-20: 0.5 mg via INTRAVENOUS

## 2016-06-20 MED ORDER — LIDOCAINE HCL 2 % EX GEL
CUTANEOUS | Status: AC
  Filled 2016-06-20: qty 5

## 2016-06-20 MED ORDER — MEPERIDINE HCL 50 MG/ML IJ SOLN: 6.2500 mg | INTRAMUSCULAR | Status: DC | PRN

## 2016-06-20 MED ORDER — FENTANYL CITRATE (PF) 100 MCG/2ML IJ SOLN
INTRAMUSCULAR | Status: DC | PRN
  Administered 2016-06-20: 50 ug via INTRAVENOUS

## 2016-06-20 MED ORDER — METOPROLOL TARTRATE 5 MG/5ML IV SOLN
INTRAVENOUS | Status: AC
  Filled 2016-06-20: qty 5

## 2016-06-20 MED ORDER — LIDOCAINE 2% (20 MG/ML) 5 ML SYRINGE
INTRAMUSCULAR | Status: AC
  Filled 2016-06-20: qty 5

## 2016-06-20 MED ORDER — BELLADONNA ALKALOIDS-OPIUM 16.2-60 MG RE SUPP
RECTAL | Status: AC
  Filled 2016-06-20: qty 1

## 2016-06-20 MED ORDER — BELLADONNA ALKALOIDS-OPIUM 16.2-60 MG RE SUPP
RECTAL | Status: DC | PRN
  Administered 2016-06-20: 1 via RECTAL

## 2016-06-20 MED ORDER — OXYCODONE HCL 5 MG PO TABS
10.0000 mg | ORAL_TABLET | ORAL | Status: DC | PRN
  Administered 2016-06-20: 10 mg via ORAL
  Filled 2016-06-20: qty 2

## 2016-06-20 MED ORDER — PROPOFOL 10 MG/ML IV BOLUS
INTRAVENOUS | Status: DC | PRN
  Administered 2016-06-20: 200 mg via INTRAVENOUS

## 2016-06-20 MED ORDER — ONDANSETRON HCL 4 MG/2ML IJ SOLN
INTRAMUSCULAR | Status: AC
  Filled 2016-06-20: qty 2

## 2016-06-20 MED ORDER — HYDRALAZINE HCL 20 MG/ML IJ SOLN
10.0000 mg | Freq: Once | INTRAMUSCULAR | Status: AC
  Administered 2016-06-20: 10 mg via INTRAVENOUS

## 2016-06-20 MED ORDER — LACTATED RINGERS IV SOLN
INTRAVENOUS | Status: DC
  Administered 2016-06-20: 1000 mL via INTRAVENOUS

## 2016-06-20 MED ORDER — FENTANYL CITRATE (PF) 100 MCG/2ML IJ SOLN
INTRAMUSCULAR | Status: AC
  Filled 2016-06-20: qty 2

## 2016-06-20 SURGICAL SUPPLY — 19 items
BAG URO CATCHER STRL LF (MISCELLANEOUS) ×2 IMPLANT
BASKET DAKOTA 1.9FR 11X120 (BASKET) ×1 IMPLANT
BASKET ZERO TIP 1.9FR (BASKET) ×2 IMPLANT
BSKT STON RTRVL ZERO TP 1.9FR (BASKET) ×1
CATH INTERMIT  6FR 70CM (CATHETERS) ×2 IMPLANT
CLOTH BEACON ORANGE TIMEOUT ST (SAFETY) ×2 IMPLANT
FIBER LASER TRAC TIP (UROLOGICAL SUPPLIES) ×1 IMPLANT
GLOVE BIOGEL M 8.0 STRL (GLOVE) ×2 IMPLANT
GOWN STRL REUS W/TWL XL LVL3 (GOWN DISPOSABLE) ×2 IMPLANT
GUIDEWIRE STR DUAL SENSOR (WIRE) ×2 IMPLANT
IV NS 1000ML (IV SOLUTION) ×2
IV NS 1000ML BAXH (IV SOLUTION) ×1 IMPLANT
MANIFOLD NEPTUNE II (INSTRUMENTS) ×2 IMPLANT
PACK CYSTO (CUSTOM PROCEDURE TRAY) ×2 IMPLANT
SHEATH ACCESS URETERAL 24CM (SHEATH) ×1 IMPLANT
SHEATH ACCESS URETERAL 38CM (SHEATH) IMPLANT
SHEATH ACCESS URETERAL 54CM (SHEATH) IMPLANT
STENT POLARIS 5FRX24 (STENTS) ×1 IMPLANT
TUBING CONNECTING 10 (TUBING) ×2 IMPLANT

## 2016-06-20 NOTE — Anesthesia Postprocedure Evaluation (Signed)
Anesthesia Post Note  Patient: Kevin Pratt  Procedure(s) Performed: Procedure(s) (LRB): CYSTOSCOPY WITH RIGHT RETROGRADE PYELOGRAM, URETEROSCOPY LASER LITHOTRIPSY AND STENT PLACEMENT (Right)  Patient location during evaluation: PACU Anesthesia Type: General Level of consciousness: sedated and patient cooperative Pain management: pain level controlled Vital Signs Assessment: post-procedure vital signs reviewed and stable Respiratory status: spontaneous breathing Cardiovascular status: stable Anesthetic complications: no    Last Vitals:  Vitals:   06/20/16 1111 06/20/16 1231  BP: 139/81 (!) 154/82  Pulse: 93 88  Resp: 16 16  Temp: 36.7 C 36.7 C    Last Pain:  Vitals:   06/20/16 1231  TempSrc:   PainSc: 4                  Lewie LoronJohn Pheobe Sandiford

## 2016-06-20 NOTE — Anesthesia Procedure Notes (Signed)
Procedure Name: LMA Insertion Date/Time: 06/20/2016 9:29 AM Performed by: Ludwig LeanJONES, Issac Moure C Pre-anesthesia Checklist: Patient identified, Emergency Drugs available, Suction available and Patient being monitored Patient Re-evaluated:Patient Re-evaluated prior to inductionOxygen Delivery Method: Circle system utilized Preoxygenation: Pre-oxygenation with 100% oxygen Intubation Type: IV induction Ventilation: Mask ventilation without difficulty LMA: LMA inserted LMA Size: 5.0 Number of attempts: 1 Placement Confirmation: positive ETCO2 and breath sounds checked- equal and bilateral Tube secured with: Tape Dental Injury: Teeth and Oropharynx as per pre-operative assessment

## 2016-06-20 NOTE — Discharge Instructions (Signed)
General Anesthesia, Adult, Care After °These instructions provide you with information about caring for yourself after your procedure. Your health care provider may also give you more specific instructions. Your treatment has been planned according to current medical practices, but problems sometimes occur. Call your health care provider if you have any problems or questions after your procedure. °What can I expect after the procedure? °After the procedure, it is common to have: °· Vomiting. °· A sore throat. °· Mental slowness. °It is common to feel: °· Nauseous. °· Cold or shivery. °· Sleepy. °· Tired. °· Sore or achy, even in parts of your body where you did not have surgery. °Follow these instructions at home: °For at least 24 hours after the procedure: °· Do not: °¨ Participate in activities where you could fall or become injured. °¨ Drive. °¨ Use heavy machinery. °¨ Drink alcohol. °¨ Take sleeping pills or medicines that cause drowsiness. °¨ Make important decisions or sign legal documents. °¨ Take care of children on your own. °· Rest. °Eating and drinking °· If you vomit, drink water, juice, or soup when you can drink without vomiting. °· Drink enough fluid to keep your urine clear or pale yellow. °· Make sure you have little or no nausea before eating solid foods. °· Follow the diet recommended by your health care provider. °General instructions °· Have a responsible adult stay with you until you are awake and alert. °· Return to your normal activities as told by your health care provider. Ask your health care provider what activities are safe for you. °· Take over-the-counter and prescription medicines only as told by your health care provider. °· If you smoke, do not smoke without supervision. °· Keep all follow-up visits as told by your health care provider. This is important. °Contact a health care provider if: °· You continue to have nausea or vomiting at home, and medicines are not helpful. °· You  cannot drink fluids or start eating again. °· You cannot urinate after 8-12 hours. °· You develop a skin rash. °· You have fever. °· You have increasing redness at the site of your procedure. °Get help right away if: °· You have difficulty breathing. °· You have chest pain. °· You have unexpected bleeding. °· You feel that you are having a life-threatening or urgent problem. °This information is not intended to replace advice given to you by your health care provider. Make sure you discuss any questions you have with your health care provider. °Document Released: 10/27/2000 Document Revised: 12/24/2015 Document Reviewed: 07/05/2015 °Elsevier Interactive Patient Education © 2017 [employer] ° ° ° ° ° ° °Post stone removal/stent placement surgery instructions ° ° °Definitions: ° °Ureter: The duct that transports urine from the kidney to the bladder. °Stent: A plastic hollow tube that is placed into the ureter, from the kidney to the bladder to prevent the ureter from swelling shut. ° °General instructions: ° °Despite the fact that no skin incisions were used, the area around the ureter and bladder is raw and irritated. The stent is a foreign body which will further irritate the bladder wall. This irritation is manifested by increased frequency of urination, both day and night, and by an increase in the urge to urinate. In some, the urge to urinate is present almost always. Sometimes the urge is strong enough that you may not be able to stop your self from urinating. The only real cure is to remove the stent and then give time for the bladder wall   to heal which can't be done until the danger of the ureter swelling shut has passed. (This varies from 2-21 days). ° °You may see some blood in your urine while the stent is in place and a few days afterward. Do not be alarmed, even if the urine is clear for a while. Get off your feet and drink lots of fluids until clearing occurs. If you start to pass clots or don't  improve, call us. ° °If you have a string coming from your urethra:  The stent string is attached to your ureteral stent.  Do not pull on thisIf you have a string coming from your urethra:  The stent string is attached to your ureteral stent.  Do not pull on this. ° °Diet: ° °You may return to your normal diet immediately. Because of the raw surface of your bladder, alcohol, spicy foods, foods high in acid and drinks with caffeine may cause irritation or frequency and should be used in moderation. To keep your urine flowing freely and avoid constipation, drink plenty of fluids during the day (8-10 glasses). Tip: Avoid cranberry juice because it is very acidic. ° °Activity: ° °Your physical activity doesn't need to be restricted. However, if you are very active, you may see some blood in the urine. We suggest that you reduce your activity under the circumstances until the bleeding has stopped. ° °Bowels: ° °It is important to keep your bowels regular during the postoperative period. Straining with bowel movements can cause bleeding. A bowel movement every other day is reasonable. Use a mild laxative if needed, such as milk of magnesia 2-3 tablespoons, or 2 Dulcolax tablets. Call if you continue to have problems. If you had been taking narcotics for pain, before, during or after your surgery, you may be constipated. Take a laxative if necessary. ° ° ° ° °Medication: ° °You should resume your pre-surgery medications unless told not to. DO NOT RESUME YOUR ASPIRIN, or any other medicines like ibuprofen, motrin, excedrin, advil, aleve, vitamin E, fish oil as these can all cause bleeding x 7 days. In addition you may be given an antibiotic to prevent or treat infection. Antibiotics are not always necessary. All medication should be taken as prescribed until the bottles are finished unless you are having an unusual reaction to one of the drugs. ° °Problems you should report to us: ° °a. Fever greater than 101°F. °b. Heavy  bleeding, or clots (see notes above about blood in urine). °c. Inability to urinate. °d. Drug reactions (hives, rash, nausea, vomiting, diarrhea). °e. Severe burning or pain with urination that is not improving. ° °Followup: ° °You will need a followup appointment to monitor your progress in most cases. Please call the office for this appointment when you get home if your appointment has not already been scheduled. Usually the first appointment will be about 5-14 days after your surgery and if you have a stent in place it will likely be removed at that time. ° °

## 2016-06-20 NOTE — Anesthesia Preprocedure Evaluation (Addendum)
Anesthesia Evaluation  Patient identified by MRN, date of birth, ID band Patient awake    Reviewed: Allergy & Precautions, NPO status , Patient's Chart, lab work & pertinent test results  Airway Mallampati: II  TM Distance: >3 FB Neck ROM: Full    Dental no notable dental hx.    Pulmonary Current Smoker,    Pulmonary exam normal breath sounds clear to auscultation       Cardiovascular hypertension, Normal cardiovascular exam Rhythm:Regular Rate:Normal     Neuro/Psych  Headaches,  Neuromuscular disease negative psych ROS   GI/Hepatic negative GI ROS, Neg liver ROS,   Endo/Other  negative endocrine ROS  Renal/GU Renal disease     Musculoskeletal negative musculoskeletal ROS (+)   Abdominal   Peds  Hematology negative hematology ROS (+)   Anesthesia Other Findings   Reproductive/Obstetrics                             Anesthesia Physical Anesthesia Plan  ASA: II  Anesthesia Plan: General   Post-op Pain Management:    Induction: Intravenous  Airway Management Planned: LMA  Additional Equipment:   Intra-op Plan:   Post-operative Plan: Extubation in OR  Informed Consent: I have reviewed the patients History and Physical, chart, labs and discussed the procedure including the risks, benefits and alternatives for the proposed anesthesia with the patient or authorized representative who has indicated his/her understanding and acceptance.   Dental advisory given  Plan Discussed with: CRNA  Anesthesia Plan Comments:         Anesthesia Quick Evaluation

## 2016-06-20 NOTE — Transfer of Care (Signed)
Immediate Anesthesia Transfer of Care Note Immediate Anesthesia Transfer of Care Note  Patient: Kevin Pratt  Procedure(s) Performed: Procedure(s): CYSTOSCOPY WITH RIGHT RETROGRADE PYELOGRAM, URETEROSCOPY LASER LITHOTRIPSY AND STENT PLACEMENT (Right)  Patient Location: PACU  Anesthesia Type:General  Level of Consciousness: Patient easily awoken, sedated, comfortable, cooperative, following commands, responds to stimulation.   Airway & Oxygen Therapy: Patient spontaneously breathing, ventilating well, oxygen via simple oxygen mask.  Post-op Assessment: Report given to PACU RN, vital signs reviewed and stable, moving all extremities.   Post vital signs: Reviewed and stable.  Complications: No apparent anesthesia complications Last Vitals:  Vitals:   06/20/16 0642  BP: 140/90  Pulse: 64  Resp: 16  Temp: 36.8 C    Last Pain:  Vitals:   06/20/16 0642  TempSrc: Oral      Patients Stated Pain Goal: 4 (06/20/16 0827)  Complications: No apparent anesthesia complications

## 2016-06-20 NOTE — Progress Notes (Signed)
Penile clamp removed

## 2016-06-20 NOTE — Op Note (Signed)
PATIENT:  Kevin MilchAnthony Kegg  PRE-OPERATIVE DIAGNOSIS:  right Ureteral calculus  POST-OPERATIVE DIAGNOSIS: Same  PROCEDURE:  1. Cystoscopy with right retrograde pyelogram including interpretation 2. Right ureteroscopy with laser lithotripsy, stone extraction and stent placement  SURGEON: Garnett FarmMark C Tarhonda Hollenberg, MD  INDICATION: Kevin Milchnthony Monrroy is a 40 year old male who has a stone located in the mid to lower right ureter. It was initially noted on CT scan. Subsequent KUBs were obtained and the stone could not definitively be visualized. On his preop KUB today I do believe I see the stone overlying the bone of the pelvis. He is brought to the operating room for ureteroscopic management of his stone.  ANESTHESIA:  General  EBL:  Minimal  DRAINS: 5 French, 24 cm Polaris stent (with string)  SPECIMEN:  Stone given to patient  DESCRIPTION OF PROCEDURE: The patient was taken to the major OR and placed on the table. General anesthesia was administered and then the patient was moved to the dorsal lithotomy position. The genitalia was sterilely prepped and draped. An official timeout was performed.  Initially the 23 French cystoscope with 30 lens was passed under direct vision into the bladder. The bladder was then fully inspected. It was noted be free of any tumors, stones or inflammatory lesions. Ureteral orifices were of normal configuration and position. A 6 French open-ended ureteral catheter was then passed through the cystoscope into the ureteral orifice in order to perform a right retrograde pyelogram.  A retrograde pyelogram was performed by injecting full-strength contrast up the right ureter under direct fluoroscopic control. It revealed a filling defect in the mid ureter consistent with the stone seen on the preoperative KUB. The remainder of the ureter was noted to be normal as was the intrarenal collecting system. I then passed a 0.038 inch floppy-tipped guidewire through the open ended catheter  and into the area of the renal pelvis and this was left in place. The inner portion of a ureteral access sheath was then passed over the guidewire to gently dilate the intramural ureter. I then proceeded with ureteroscopy.  A 6 French rigid ureteroscope was then passed under direct into the bladder and into the right orifice and up the ureter. The stone was identified and I felt it was too large to extract and therefore elected to proceed with laser lithotripsy. The 200  holmium laser fiber was used to fragment the stone. I then used the nitinol basket to extract all of the stone fragments and reinspection of the ureter ureteroscopically revealed no further stone fragments and no injury to the ureter. I then backloaded the cystoscope over the guidewire and passed the stent over the guidewire into the area of the renal pelvis. As the guidewire was removed good curl was noted in the renal pelvis. The bladder was drained and the cystoscope was then removed. The patient tolerated the procedure well no intraoperative complications.  PLAN OF CARE: Discharge to home after PACU  PATIENT DISPOSITION:  PACU - hemodynamically stable.

## 2017-12-27 IMAGING — DX DG ABDOMEN 1V
1 series · 1 of 1 positions shown · non-contrast
Comparison: Radiograph June 05, 2016. CT scan April 11, 2016.

CLINICAL DATA: Right-sided kidney stone.

EXAM:
ABDOMEN - 1 VIEW

[abdomen kub]
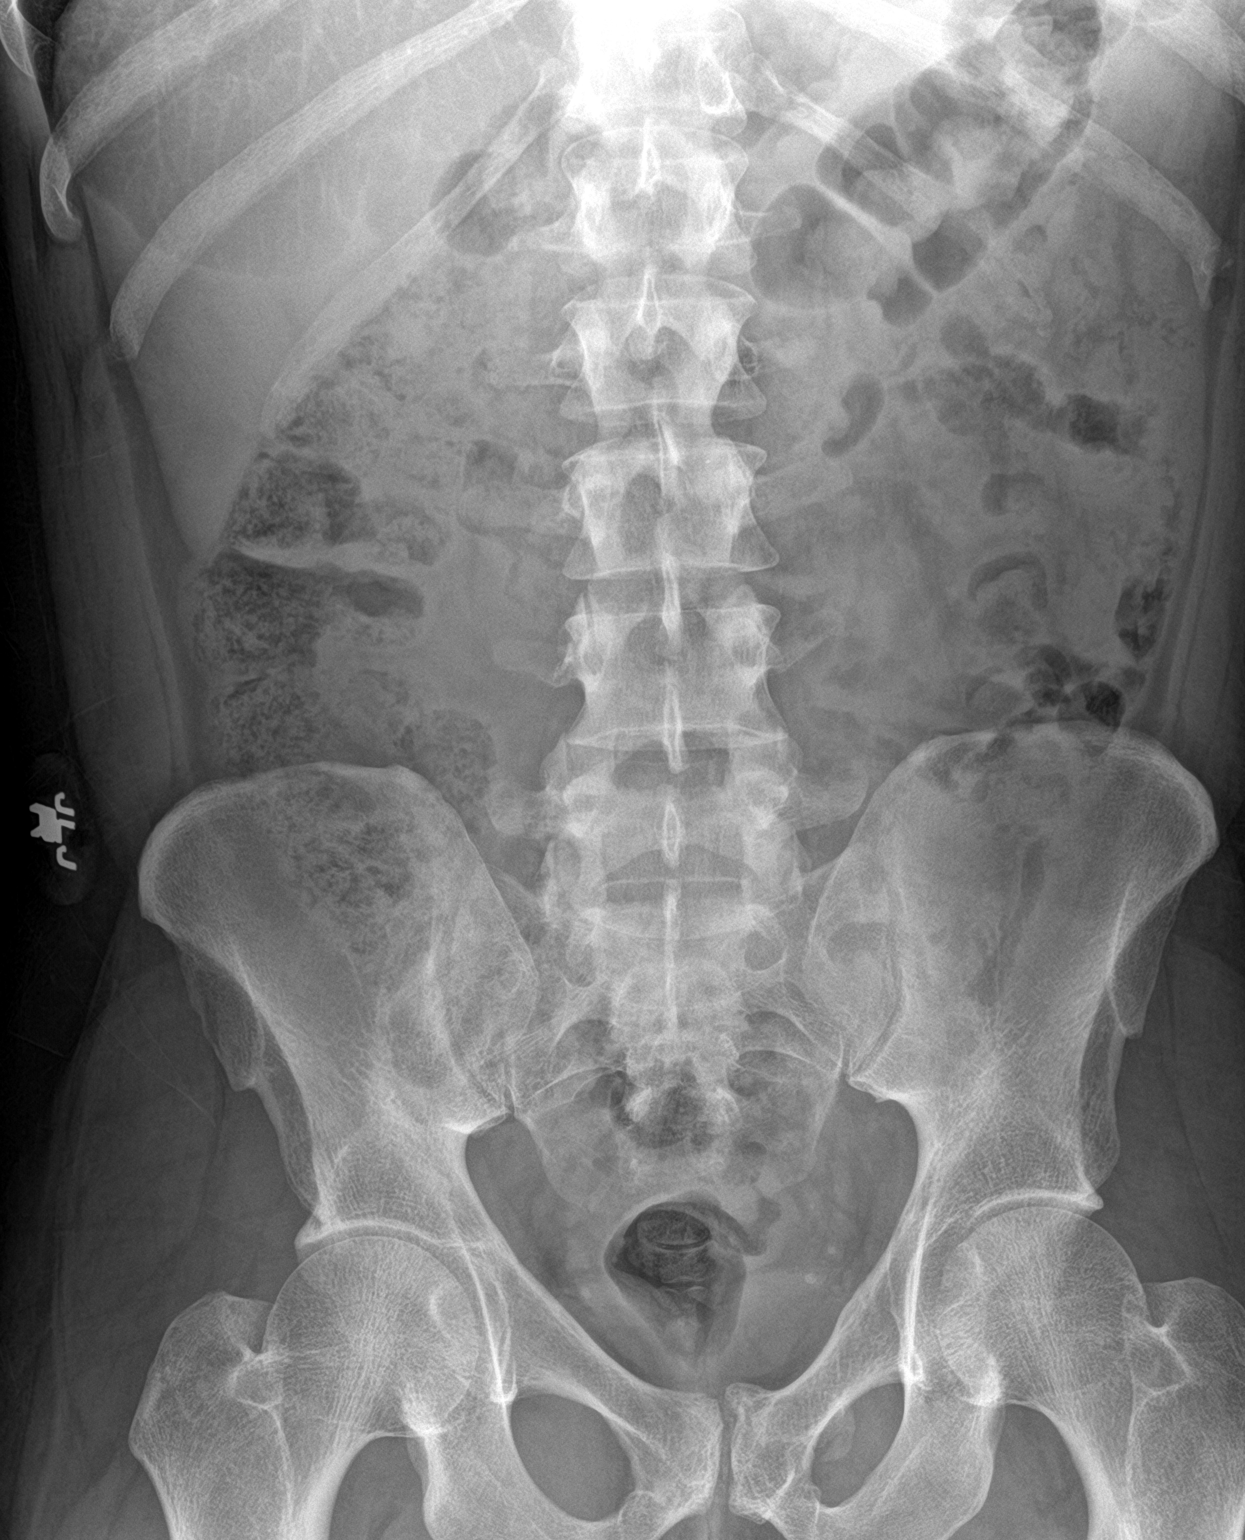

[1 of 1 positions shown; findings below may reference images not displayed]

FINDINGS: The bowel gas pattern is normal. No definite renal or ureteral
calculi are noted. Probable phleboliths are noted in the pelvis.
IMPRESSION: No evidence of bowel obstruction or ileus. No definite evidence of
renal or ureteral calculi.
